# Patient Record
Sex: Female | Born: 1995 | Hispanic: No | Marital: Single | Smoking: Never smoker
Health system: Southern US, Community
[De-identification: ages and names within clinical notes are randomized; demographics above are authoritative.]

## PROBLEM LIST (undated history)

## (undated) HISTORY — PX: WISDOM TOOTH EXTRACTION: SHX21

---

## 2019-01-25 ENCOUNTER — Emergency Department (HOSPITAL_COMMUNITY)
Admission: EM | Admit: 2019-01-25 | Discharge: 2019-01-25 | Disposition: A | Discharge status: Home / Self Care | Payer: Self-pay | Attending: Emergency Medicine | Admitting: Emergency Medicine

## 2019-01-25 ENCOUNTER — Emergency Department (HOSPITAL_COMMUNITY): Payer: Self-pay

## 2019-01-25 ENCOUNTER — Encounter (HOSPITAL_COMMUNITY): Payer: Self-pay | Admitting: Emergency Medicine

## 2019-01-25 ENCOUNTER — Other Ambulatory Visit: Payer: Self-pay

## 2019-01-25 DIAGNOSIS — R220 Localized swelling, mass and lump, head: Secondary | ICD-10-CM

## 2019-01-25 DIAGNOSIS — R600 Localized edema: Secondary | ICD-10-CM | POA: Insufficient documentation

## 2019-01-25 LAB — CBC WITH DIFFERENTIAL/PLATELET
Abs Immature Granulocytes: 0.06 10*3/uL (ref 0.00–0.07)
Basophils Absolute: 0 10*3/uL (ref 0.0–0.1)
Basophils Relative: 0 %
Eosinophils Absolute: 0 10*3/uL (ref 0.0–0.5)
Eosinophils Relative: 0 %
HCT: 42.3 % (ref 36.0–46.0)
Hemoglobin: 14.5 g/dL (ref 12.0–15.0)
Immature Granulocytes: 1 %
Lymphocytes Relative: 10 %
Lymphs Abs: 1.2 10*3/uL (ref 0.7–4.0)
MCH: 31.1 pg (ref 26.0–34.0)
MCHC: 34.3 g/dL (ref 30.0–36.0)
MCV: 90.8 fL (ref 80.0–100.0)
Monocytes Absolute: 0.6 10*3/uL (ref 0.1–1.0)
Monocytes Relative: 5 %
Neutro Abs: 10.5 10*3/uL — ABNORMAL HIGH (ref 1.7–7.7)
Neutrophils Relative %: 84 %
Platelets: 321 10*3/uL (ref 150–400)
RBC: 4.66 MIL/uL (ref 3.87–5.11)
RDW: 11.9 % (ref 11.5–15.5)
WBC: 12.4 10*3/uL — ABNORMAL HIGH (ref 4.0–10.5)
nRBC: 0 % (ref 0.0–0.2)

## 2019-01-25 LAB — COMPREHENSIVE METABOLIC PANEL
ALT: 36 U/L (ref 0–44)
AST: 35 U/L (ref 15–41)
Albumin: 4.3 g/dL (ref 3.5–5.0)
Alkaline Phosphatase: 90 U/L (ref 38–126)
Anion gap: 11 (ref 5–15)
BUN: 9 mg/dL (ref 6–20)
CO2: 20 mmol/L — ABNORMAL LOW (ref 22–32)
Calcium: 9.3 mg/dL (ref 8.9–10.3)
Chloride: 106 mmol/L (ref 98–111)
Creatinine, Ser: 0.72 mg/dL (ref 0.44–1.00)
GFR calc Af Amer: >60 mL/min (ref >60)
GFR calc non Af Amer: >60 mL/min (ref >60)
Glucose, Bld: 87 mg/dL (ref 70–99)
Potassium: 3.6 mmol/L (ref 3.5–5.1)
Sodium: 137 mmol/L (ref 135–145)
Total Bilirubin: 0.5 mg/dL (ref 0.3–1.2)
Total Protein: 8.3 g/dL — ABNORMAL HIGH (ref 6.5–8.1)

## 2019-01-25 LAB — I-STAT BETA HCG BLOOD, ED (MC, WL, AP ONLY): I-stat hCG, quantitative: <5 m[IU]/mL (ref <5)

## 2019-01-25 LAB — LACTIC ACID, PLASMA: Lactic Acid, Venous: 1.1 mmol/L (ref 0.5–1.9)

## 2019-01-25 MED ORDER — CLINDAMYCIN PHOSPHATE 900 MG/50ML IV SOLN
900.0000 mg | Freq: Once | INTRAVENOUS | Status: AC
  Administered 2019-01-25: 900 mg via INTRAVENOUS
  Filled 2019-01-25: qty 50

## 2019-01-25 MED ORDER — SODIUM CHLORIDE 0.9 % IV BOLUS (SEPSIS)
1000.0000 mL | Freq: Once | INTRAVENOUS | Status: AC
  Administered 2019-01-25: 1000 mL via INTRAVENOUS

## 2019-01-25 MED ORDER — CHLORHEXIDINE GLUCONATE 0.12 % MT SOLN
15.0000 mL | Freq: Two times a day (BID) | OROMUCOSAL | Status: DC
  Administered 2019-01-25: 15 mL via OROMUCOSAL
  Filled 2019-01-25: qty 15

## 2019-01-25 MED ORDER — CLINDAMYCIN HCL 150 MG PO CAPS: 300.0000 mg | ORAL_CAPSULE | Freq: Four times a day (QID) | ORAL | 0 refills | Status: AC

## 2019-01-25 MED ORDER — IOHEXOL 300 MG/ML  SOLN
75.0000 mL | Freq: Once | INTRAMUSCULAR | Status: AC | PRN
  Administered 2019-01-25: 75 mL via INTRAVENOUS

## 2019-01-25 MED ORDER — ACETAMINOPHEN 160 MG/5ML PO SOLN
650.0000 mg | Freq: Once | ORAL | Status: AC
  Administered 2019-01-25: 650 mg via ORAL
  Filled 2019-01-25: qty 20.3

## 2019-01-25 NOTE — ED Provider Notes (Signed)
[facility] EMERGENCY DEPARTMENT Provider Note   CSN: [id number] Arrival date & time: 01/25/19  1707    History   Chief Complaint Chief Complaint  Patient presents with   Facial Swelling    HPI Sheri Vega is a 23 y.o. female.   Pt presents today stating that she had a wisdom tooth extracted about 4 hours ago.   Pt states that she was told to "go to the ED if the swelling got bad".   Pt presenting for increased swelling and pain to right lower jaw.   She denies fever, difficulty swallowing, voice changes, vomiting.  She has no other PMH/MEDS/SBI.  She is currently taking amoxicillin and flagyl.   She appears mildly ill, but non-toxic.  The history is provided by the patient.    History reviewed. No pertinent past medical history.  There are no active problems to display for this patient.      OB History   No obstetric history on file.      Home Medications    Prior to Admission medications   Not on File    Family History No family history on file.  Social History Social History   Tobacco Use   Smoking status: Never Smoker   Smokeless tobacco: Never Used  Substance Use Topics   Alcohol use: Yes   Drug use: Never     Allergies   Patient has no known allergies.   Review of Systems Review of Systems  Constitutional: Negative for chills and fever.  HENT: Positive for dental problem and facial swelling. Negative for drooling, ear pain, rhinorrhea, sinus pressure, sore throat and trouble swallowing.   Eyes: Negative for pain.  Respiratory: Negative for choking and shortness of breath.   Cardiovascular: Negative for chest pain.  Gastrointestinal: Negative for abdominal pain, nausea and vomiting.  Musculoskeletal: Positive for neck pain (right anterior neck pain).  Skin: Negative for rash.  Neurological: Negative for dizziness and headaches.     Physical Exam Updated Vital Signs BP 111/69    Pulse 76    Temp (!) 100.5  F (38.1 C) (Oral)    Resp 18    SpO2 98%   Physical Exam Vitals signs and nursing note reviewed.  Constitutional:      General: She is not in acute distress.    Appearance: Normal appearance. She is ill-appearing. She is not toxic-appearing or diaphoretic.  HENT:     Head: Normocephalic and atraumatic.     Right Ear: Tympanic membrane normal.     Left Ear: Tympanic membrane normal.     Nose: Nose normal.     Mouth/Throat:     Mouth: Mucous membranes are moist.     Comments: Noted significant swelling to right lower jaw and right lateral neck;  -trismus; noted extracted tooth with packing in place;  Small amount of blood noted to region;  No purulent discharge or foul smelling discharge;  Uvula midline;  No swelling to posterior pharynx;  No stridor;  Swelling noted lateral to tooth, but no swelling under tongue or floor of mouth;  No apparent lymphadenopathy;  Pt handling secretions well;  Eyes:     General:        Right eye: No discharge.        Left eye: No discharge.  Pulmonary:     Effort: Pulmonary effort is normal. No respiratory distress.  Abdominal:     General: Abdomen is flat. There is no distension.  Tenderness: There is no abdominal tenderness.  Skin:    General: Skin is warm.  Neurological:     Mental Status: She is alert and oriented to person, place, and time.     Coordination: Coordination normal.  Psychiatric:        Mood and Affect: Mood normal.        Behavior: Behavior normal.      ED Treatments / Results  Labs (all labs ordered are listed, but only abnormal results are displayed) Labs Reviewed  CBC WITH DIFFERENTIAL/PLATELET - Abnormal; Notable for the following components:      Result Value   WBC 12.4 (*)    Neutro Abs 10.5 (*)    All other components within normal limits  COMPREHENSIVE METABOLIC PANEL - Abnormal; Notable for the following components:   CO2 20 (*)    Total Protein 8.3 (*)    All other components within normal limits    CULTURE, BLOOD (ROUTINE X 2)  CULTURE, BLOOD (ROUTINE X 2)  LACTIC ACID, PLASMA  I-STAT BETA HCG BLOOD, ED ([facility], [facility], [facility] ONLY)    EKG None  Radiology Ct Soft Tissue Neck W Contrast  Result Date: 01/25/2019 CLINICAL DATA:  Status post extraction of wisdom teeth. Worsening swelling. EXAM: CT NECK WITH CONTRAST TECHNIQUE: Multidetector CT imaging of the neck was performed using the standard protocol following the bolus administration of intravenous contrast. CONTRAST:  7https://[email or web address] HospCherry CrLu575Lesly RubensteAvera Marshall Reg Med CenteriALa52https://el785 5Fannin Regional HospRichmLu530Lesly RubensteSpartanburg Regional Medical CenteriARo89https://el(551)1[facility] Surgery DiviPresLu3Lesly RubensteNortheast Rehabilitation HospitaliALil31https://el(252)3Ucsd Center For Surgery Of EncinitaLeLu(416Lesly RubensteEastern State HospitaliADelawa81https://el(254)8[facility] CPotsLu5Lesly RubensteSun City Az Endoscopy Asc LLCiACuyl20https://el225Bellin Health Oconto HospBlawenbLu(504)Lesly RubensteNortheastern CenteriAWest Ma70https://el817-4[facility] LebElliLu403Lesly RubensteYuma Regional Medical CenteriAThre49https://el514Middlesex HospJacksonviLu(437Lesly RubensteWest Shore Surgery Center LtdiAEch35https://el(234)2[facility] KaFloral PLu(769Lesly RubensteOverland Park Reg Med CtriACeda81https://el(813)2[facility] ORLu250Lesly RubensteValir Rehabilitation Hospital Of OkciA37https://el970-6Alb[facility] Memorial HospParkLu561Lesly RubensteCrane Creek Surgical Partners LLCiAEl64https://el718-4[facility] CheSam RaybLu302Lesly RubensteGastro Care LLCiARo1https://el914-1The Out[facility] Banks HospBenton HarLu228Lesly RubensteSummit Surgical Center LLCiAR28https://el437-3[facility] BurLu571Lesly RubensteCenter For Advanced SurgeryiAL42https://el952 2[facility] Charl[facility] Memorial HospVillage GrLu(309)Lesly RubensteDesoto Regional Health SystemiAWaln22https://el812Mor[facility] County HospSayreviLu463Lesly RubensteEncompass Health Rehabilitation Hospital Of TallahasseeiACl40https://el640Hen[facility] Doctors' Hospital - PaHelmeLu413Lesly RubensteWeimar Medical CenteriAChoct53https://el806Silicon Valley Surgery CenteGlenns FeLu(437Lesly RubensteTemecula Ca United Surgery Center LP Dba United Surgery Center Temeculai30https://el317 3[facility] CLincLu843Lesly RubensteAirport Endoscopy CenteriAPleasant ViewonialellingXOL 300 MG/ML  SOLN COMPARISON:  None. FINDINGS: PHARYNX AND LARYNX: --Nasopharynx: Fossae of Rosenmuller are clear. Normal adenoid tonsils for age. --Oral cavity and oropharynx: Tooth 32 has been extracted. There is surrounding soft tissue swelling and soft tissue emphysema that is likely postoperative. There is edema of the right masseter muscle. --Hypopharynx: Normal vallecula and pyriform sinuses. --Larynx: Normal epiglottis and pre-epiglottic space. Normal aryepiglottic and vocal folds. --Retropharyngeal space: No abscess, effusion or lymphadenopathy. SALIVARY GLANDS: --Parotid: No mass lesion or inflammation. No sialolithiasis or ductal dilatation. --Submandibular: Symmetric without inflammation. No sialolithiasis or ductal dilatation. --Sublingual: Normal. No ranula or other visible lesion of the base of tongue and floor of mouth. THYROID: Normal. LYMPH NODES: Multiple level 1B lymph nodes measure up to 6 mm. Right level 2A nodes measure up to 10 mm. VASCULAR: Major cervical vessels are patent. LIMITED INTRACRANIAL: Normal. VISUALIZED ORBITS: Normal. MASTOIDS AND VISUALIZED PARANASAL SINUSES: No fluid levels or advanced mucosal thickening. No mastoid effusion. SKELETON: No bony spinal canal stenosis. No lytic or blastic lesions. UPPER CHEST: Clear. OTHER: There is lower right facial soft tissue swelling with thickening of the platysma muscle extending to the submandibular region.   IMPRESSION: 1. Status post  extraction of tooth 32 with associated soft tissue swelling and edema of the right masseter muscle. No abscess or drainable fluid collection. 2. Reactive level 1B and 2A lymphadenopathy. 3. Right-sided facial subcutaneous edema and thickening of the platysma muscle. Electronically Signed   By: Kevin  Herman M.D.   On: 01/25/2019 19:56    Procedures Procedures (including critical care time)  Medications Ordered in ED Medications  acetaminophen (TYLENOL) solution 650 mg (650 mg Oral Given 01/25/19 1913)  clindamycin (CLEOCIN) IVPB 900 mg (900 mg Intravenous New Bag/Given 01/25/19 1951)  sodium chloride 0.9 % bolus 1,000 mL (1,000 mLs Intravenous New Bag/Given 01/25/19 1949)  iohexol (OMNIPAQUE) 300 MG/ML solution 75 mL (75 mLs Intravenous Contrast Given 01/25/19 1930)     Initial Impression / Assessment and Plan / ED Course  I have reviewed the triage vital signs and the nursing  notes.  Pertinent labs & imaging results that were available during my care of the patient were reviewed by me and considered in my medical decision making (see chart for details).       Labs/imaging initiated to evaluate for abscess or possible early Ludwigs, although felt to be less likely since procedure was only about 4 hours ago.   Noted low grade fever and leukocytosis;   I feel this to be more reactive to oral trauma rather than sign of true infection.   IV was started here and pt was given fluids, clindamycin 900 mg IV X 1, tylenol for fever control.    CT scan results reviewed;   There is no sign of abscess.   There is edema to right masseter and platysma muscles.   I have called Dr. Garvin Fila, on call dentistry.   I have reviewed pt HPI, PE, labs and imaging.  He states that infection this early s/p extraction is very unlikely.   He feels that since there is a small amount of blood around the packing w/out purulent/odorous discharge that there is likely a small periosteal bleeding which is not uncommon in wisdom teeth  extractions.   He also agrees that the leukocytosis and low grade fever are likely reactive.   He advises a chlorhexadine rinse and to continue the clindamycin at home.   Discontinue amoxicillin and flagyl.  I have counseled pt on all findings and plan of care.   She understands to use ice packs to help with swelling and pain and to call her dentist tomorrow.  I have also given her Dr. Sol Blazing number if needed.   Pt understands and is agreeable to plan.  Final Clinical Impressions(s) / ED Diagnoses   Final diagnoses:  None    ED Discharge Orders    None       Joeann Steppe K, PA-C 01/25/19 2110    Maia Plan, MD 01/26/19 [phone]

## 2019-01-25 NOTE — ED Triage Notes (Signed)
PT taking amoxicillin 500mg   POI every 6 hrs and  Metronidazole 250mg   1 PO every 8 hhrs. Tooth was pulled this AM.  Pt has swelling to RT lower face.

## 2019-01-25 NOTE — Discharge Instructions (Signed)
Please use ice pack to reduce swelling at home.   Discontinue amoxicillin and flagyl.  Start clindamycin tomorrow.   Please call dentistry tomorrow for follow up appointment.   Return to ED sooner if needed.

## 2019-01-25 NOTE — ED Triage Notes (Signed)
Pt reports having infection to her mouth had surgery today to have wisdom teeth removed and area drained. Pt reports she was told to come her for increased swelling. Pt reports swelling has increased. Pt able to talk, able to swallow saliva, reports pain with swallowing. She denies sick contact or recent travel

## 2019-01-30 LAB — CULTURE, BLOOD (ROUTINE X 2)
Culture: NO GROWTH
Culture: NO GROWTH
Special Requests: ADEQUATE
Special Requests: ADEQUATE

## 2020-02-25 ENCOUNTER — Ambulatory Visit: Payer: Self-pay | Attending: Internal Medicine

## 2020-02-25 DIAGNOSIS — Z20822 Contact with and (suspected) exposure to covid-19: Secondary | ICD-10-CM | POA: Insufficient documentation

## 2020-02-26 LAB — SARS-COV-2, NAA 2 DAY TAT

## 2020-02-26 LAB — NOVEL CORONAVIRUS, NAA: SARS-CoV-2, NAA: NOT DETECTED

## 2020-05-13 ENCOUNTER — Emergency Department
Admission: EM | Admit: 2020-05-13 | Discharge: 2020-05-13 | Disposition: A | Discharge status: Home / Self Care | Payer: Self-pay | Attending: Emergency Medicine | Admitting: Emergency Medicine

## 2020-05-13 ENCOUNTER — Other Ambulatory Visit: Payer: Self-pay

## 2020-05-13 ENCOUNTER — Encounter: Payer: Self-pay | Admitting: Intensive Care

## 2020-05-13 DIAGNOSIS — K120 Recurrent oral aphthae: Secondary | ICD-10-CM | POA: Insufficient documentation

## 2020-05-13 LAB — GROUP A STREP BY PCR: Group A Strep by PCR: NOT DETECTED

## 2020-05-13 MED ORDER — LIDOCAINE VISCOUS HCL 2 % MT SOLN: 15.0000 mL | OROMUCOSAL | 0 refills | Status: DC | PRN

## 2020-05-13 MED ORDER — LIDOCAINE VISCOUS HCL 2 % MT SOLN
15.0000 mL | Freq: Once | OROMUCOSAL | Status: AC
  Administered 2020-05-13: 15 mL via OROMUCOSAL
  Filled 2020-05-13: qty 15

## 2020-05-13 MED ORDER — ACETAMINOPHEN 325 MG PO TABS
650.0000 mg | ORAL_TABLET | Freq: Once | ORAL | Status: AC | PRN
  Administered 2020-05-13: 650 mg via ORAL
  Filled 2020-05-13: qty 2

## 2020-05-13 MED ORDER — PREDNISONE 20 MG PO TABS: 40.0000 mg | ORAL_TABLET | Freq: Every day | ORAL | 0 refills | Status: AC

## 2020-05-13 MED ORDER — CLOTRIMAZOLE 10 MG MT TROC: 10.0000 mg | Freq: Every day | OROMUCOSAL | 0 refills | Status: DC

## 2020-05-13 MED ORDER — PREDNISONE 20 MG PO TABS
60.0000 mg | ORAL_TABLET | Freq: Once | ORAL | Status: AC
  Administered 2020-05-13: 60 mg via ORAL
  Filled 2020-05-13: qty 3

## 2020-05-13 NOTE — ED Provider Notes (Signed)
[facility] Emergency Department Provider Note ____________________________________________  Time seen: [phone]  I have reviewed the triage vital signs and the nursing notes.  HISTORY  Chief Complaint  Fever, Mouth Lesions, and Sore Throat  HPI Sheri Vega is a 24 y.o. female presents to the ED  for evaluation of acute sore throat pain and mouth pain.  Patient reports "not feeling well," since Wednesday.  Today she reports awakening with patches in her mouth.  She reports fevers as well today.  She has been able to swallow liquids, and control her secretions.  She denies any difficulty breathing or swallowing.  No recent dental work is reported.  History reviewed. No pertinent past medical history.  There are no problems to display for this patient.   Past Surgical History:  Procedure Laterality Date  . WISDOM TOOTH EXTRACTION      Prior to Admission medications   Medication Sig Start Date End Date Taking? Authorizing Provider  clotrimazole (MYCELEX) 10 MG troche Take 1 tablet (10 mg total) by mouth 5 (five) times daily. 05/13/20   Darrnell Mangiaracina, Charlesetta Ivory, PA-C  lidocaine (XYLOCAINE) 2 % solution Use as directed 15 mLs in the mouth or throat every 4 (four) hours as needed for mouth pain. 05/13/20   Alfrieda Tarry, Charlesetta Ivory, PA-C  predniSONE (DELTASONE) 20 MG tablet Take 2 tablets (40 mg total) by mouth daily with breakfast for 5 days. 05/13/20 05/18/20  Colleene Swarthout, Charlesetta Ivory, PA-C    Allergies Patient has no known allergies.  History reviewed. No pertinent family history.  Social History Social History   Tobacco Use  . Smoking status: Never Smoker  . Smokeless tobacco: Never Used  Vaping Use  . Vaping Use: Never used  Substance Use Topics  . Alcohol use: Yes    Alcohol/week: 3.0 standard drinks    Types: 3 Shots of liquor per week  . Drug use: Never    Review of Systems  Constitutional: Positive for fever. Eyes: Negative for visual  changes. ENT: Positive for sore throat and mouth ulcers Cardiovascular: Negative for chest pain. Respiratory: Negative for shortness of breath. Gastrointestinal: Negative for abdominal pain, vomiting and diarrhea. Genitourinary: Negative for dysuria. Musculoskeletal: Negative for back pain. Skin: Negative for rash. Neurological: Negative for headaches, focal weakness or numbness. ____________________________________________  PHYSICAL EXAM:  VITAL SIGNS: ED Triage Vitals [05/13/20 1309]  Enc Vitals Group     BP (!) 133/90     Pulse Rate (!) 120     Resp 18     Temp (!) 102.8 F (39.3 C)     Temp Source Oral     SpO2 99 %     Weight (!) 210 lb (95.3 kg)     Height 5\' 7"  (1.702 m)     Head Circumference      Peak Flow      Pain Score 8     Pain Loc      Pain Edu?      Excl. in GC?     Constitutional: Alert and oriented. Well appearing and in no distress. Head: Normocephalic and atraumatic. Eyes: Conjunctivae are normal. PERRL. Normal extraocular movements Ears: Canals clear. TMs intact bilaterally. Nose: No congestion/rhinorrhea/epistaxis. Mouth/Throat: Mucous membranes are moist.  Uvula is midline and tonsils are flat.  Oropharynx is erythematous with scattered white, flat, ulcerated patches consistent with aphthous ulcers.  The ulcers are located on the uvula, hard palate, and tongue.  Patient also has some increased markings to her tongue  with white lesions.  No brawny sublingual erythema is appreciated no focal gum swelling or abscess is noted. Neck: Supple. No thyromegaly. Hematological/Lymphatic/Immunological: No cervical lymphadenopathy. Cardiovascular: Normal rate, regular rhythm. Normal distal pulses. Respiratory: Normal respiratory effort. No wheezes/rales/rhonchi. Gastrointestinal: Soft and nontender. No distention. Musculoskeletal: Nontender with normal range of motion in all extremities.  Neurologic:  Normal gait without ataxia. Normal speech and language. No  gross focal neurologic deficits are appreciated. Skin:  Skin is warm, dry and intact. No rash noted. ____________________________________________   LABS (pertinent positives/negatives) Labs Reviewed  GROUP A STREP BY PCR  ____________________________________________  PROCEDURES  Prednisone 60 mg PO Lidocaine 2 % viscous gargle Tylenol 650 mg PO  Procedures ____________________________________________  INITIAL IMPRESSION / ASSESSMENT AND PLAN / ED COURSE  DDX: strep pharyngitis, stomatitis, dental abscess, GERD  Patient with ED evaluation of sore throat and onset of irritation, erythema, and ulceration to the mouth.  Patient presents today with onset of symptoms including fever and sore throat pain.  Her exam is benign as it shows no signs of acute dehydration, respiratory distress, or airway compromise.  She does have multiple shallow lesions consistent with aphthous ulcers.  Patient will be treated empirically with lidocaine mouthwash and prednisone.  Prescription for miconazole atrocious is also provided for any possible coinfection with Candida.  Patient will follow with primary provider return to the ED as needed.  Sheri Vega was evaluated in Emergency Department on 05/13/2020 for the symptoms described in the history of present illness. She was evaluated in the context of the global COVID-19 pandemic, which necessitated consideration that the patient might be at risk for infection with the SARS-CoV-2 virus that causes COVID-19. Institutional protocols and algorithms that pertain to the evaluation of patients at risk for COVID-19 are in a state of rapid change based on information released by regulatory bodies including the CDC and federal and state organizations. These policies and algorithms were followed during the patient's care in the ED. ____________________________________________  FINAL CLINICAL IMPRESSION(S) / ED DIAGNOSES  Final diagnoses:  Aphthous stomatitis       Karmen Stabs, Charlesetta Ivory, PA-C 05/13/20 1910    Emily Filbert, MD 05/14/20 [phone]

## 2020-05-13 NOTE — ED Triage Notes (Signed)
Patient presents with sore throat and c/o "not feeling well" since Wednesday. Today woke up with bumps/patches all in mouth.

## 2020-05-13 NOTE — Discharge Instructions (Signed)
You are being treated for oral ulcers.  Take the prescription steroid as directed.  Use the viscous lidocaine solution as needed.  Eat soft foods and soothing liquids and continue to monitor and treat fevers.  Return to the ED as necessary.

## 2020-06-15 ENCOUNTER — Ambulatory Visit (HOSPITAL_COMMUNITY)
Admission: EM | Admit: 2020-06-15 | Discharge: 2020-06-15 | Disposition: A | Discharge status: Home / Self Care | Payer: Self-pay | Attending: Family Medicine | Admitting: Family Medicine

## 2020-06-15 ENCOUNTER — Other Ambulatory Visit: Payer: Self-pay

## 2020-06-15 ENCOUNTER — Encounter (HOSPITAL_COMMUNITY): Payer: Self-pay

## 2020-06-15 DIAGNOSIS — J039 Acute tonsillitis, unspecified: Secondary | ICD-10-CM | POA: Insufficient documentation

## 2020-06-15 LAB — POCT RAPID STREP A, ED / UC: Streptococcus, Group A Screen (Direct): NEGATIVE

## 2020-06-15 MED ORDER — ACETAMINOPHEN 325 MG PO TABS
650.0000 mg | ORAL_TABLET | Freq: Once | ORAL | Status: AC
  Administered 2020-06-15: 650 mg via ORAL

## 2020-06-15 MED ORDER — AMOXICILLIN-POT CLAVULANATE 875-125 MG PO TABS: 1.0000 | ORAL_TABLET | Freq: Two times a day (BID) | ORAL | 0 refills | Status: AC

## 2020-06-15 MED ORDER — ACETAMINOPHEN 325 MG PO TABS
ORAL_TABLET | ORAL | Status: AC
  Filled 2020-06-15: qty 2

## 2020-06-15 MED ORDER — HYDROCODONE-ACETAMINOPHEN 7.5-325 MG/15ML PO SOLN: 10.0000 mL | Freq: Four times a day (QID) | ORAL | 0 refills | Status: AC | PRN

## 2020-06-15 NOTE — Discharge Instructions (Signed)
Take the antibiotic 2 x a day Take 2 doses today Take the pain medicine as needed Use salt water gargles Try to drink mor water Throat culture will be available 2-3 days

## 2020-06-15 NOTE — ED Provider Notes (Signed)
[facility]    CSN: [id number] Arrival date & time: 06/15/20  0920      History   Chief Complaint Chief Complaint  Patient presents with  . Sore Throat    HPI Sheri Vega is a 24 y.o. female.   HPI  Patient has a very painful sore throat.  Started yesterday.  No known exposure to strep or Covid.  She works in a [employer].  Her temperature has been high overnight with fever and chills.  Headache.  Painful swallowing.  No runny nose or cough.  History reviewed. No pertinent past medical history.  There are no problems to display for this patient.   Past Surgical History:  Procedure Laterality Date  . WISDOM TOOTH EXTRACTION      OB History   No obstetric history on file.      Home Medications    Prior to Admission medications   Medication Sig Start Date End Date Taking? Authorizing Provider  amoxicillin-clavulanate (AUGMENTIN) 875-125 MG tablet Take 1 tablet by mouth every 12 (twelve) hours. 06/15/20   Eustace Moore, MD  HYDROcodone-acetaminophen (HYCET) 7.5-325 mg/15 ml solution Take 10 mLs by mouth 4 (four) times daily as needed for moderate pain. 06/15/20   Eustace Moore, MD    Family History History reviewed. No pertinent family history.  Social History Social History   Tobacco Use  . Smoking status: Never Smoker  . Smokeless tobacco: Never Used  Vaping Use  . Vaping Use: Never used  Substance Use Topics  . Alcohol use: Yes    Alcohol/week: 3.0 standard drinks    Types: 3 Shots of liquor per week  . Drug use: Never     Allergies   Patient has no known allergies.   Review of Systems Review of Systems See HPI  Physical Exam Triage Vital Signs ED Triage Vitals [06/15/20 1106]  Enc Vitals Group     BP 110/63     Pulse Rate (!) 105     Resp 20     Temp (!) 101.3 F (38.5 C)     Temp Source Oral     SpO2 100 %     Weight      Height      Head Circumference      Peak Flow      Pain Score 9     Pain Loc       Pain Edu?      Excl. in GC?    No data found.  Updated Vital Signs BP 110/63 (BP Location: Right Arm)   Pulse (!) 105   Temp (!) 101.3 F (38.5 C) (Oral)   Resp 20   SpO2 100%      Physical Exam Constitutional:      Appearance: She is well-developed. She is ill-appearing.     Comments: Appears uncomfortable.  Swallows with difficulty.  Muffled voice.  HENT:     Head: Normocephalic.     Right Ear: Tympanic membrane and ear canal normal.     Left Ear: Tympanic membrane and ear canal normal.     Nose: No congestion.     Mouth/Throat:     Mouth: Mucous membranes are dry.     Pharynx: Uvula midline. Pharyngeal swelling and posterior oropharyngeal erythema present.     Tonsils: No tonsillar exudate or tonsillar abscesses. 3+ on the right. 2+ on the left.  Eyes:     Conjunctiva/sclera: Conjunctivae normal.  Cardiovascular:     Rate and  Rhythm: Normal rate and regular rhythm.  Pulmonary:     Effort: Pulmonary effort is normal.     Breath sounds: Normal breath sounds.  Lymphadenopathy:     Cervical: No cervical adenopathy.  Skin:    General: Skin is warm and dry.     Findings: No rash.  Neurological:     General: No focal deficit present.     Mental Status: She is alert.  Psychiatric:        Behavior: Behavior normal.      UC Treatments / Results  Labs (all labs ordered are listed, but only abnormal results are displayed) Labs Reviewed  CULTURE, GROUP A STREP [facility])  POCT RAPID STREP A, ED / UC    EKG   Radiology No results found.  Procedures Procedures (including critical care time)  Medications Ordered in UC Medications  acetaminophen (TYLENOL) tablet 650 mg (650 mg Oral Given 06/15/20 1112)    Initial Impression / Assessment and Plan / UC Course  I have reviewed the triage vital signs and the nursing notes.  Pertinent labs & imaging results that were available during my care of the patient were reviewed by me and considered in my medical decision  making (see chart for details).  Clinical Course as of Jun 15 2244  Thu Jun 15, 2020  1131 POCT Rapid Strep [YN]    Clinical Course User Index [YN] Eustace Moore, MD    Rapid strep is negative.  Patient has significant tonsillitis with tonsillar swelling that is a bit asymmetric.  Worry about the development of a peritonsillar abscess.  We did treat her with antibiotics.  Pain management.  Return if worse at any time instead of better. Final Clinical Impressions(s) / UC Diagnoses   Final diagnoses:  Tonsillitis     Discharge Instructions     Take the antibiotic 2 x a day Take 2 doses today Take the pain medicine as needed Use salt water gargles Try to drink mor water Throat culture will be available 2-3 days    ED Prescriptions    Medication Sig Dispense Auth. Provider   amoxicillin-clavulanate (AUGMENTIN) 875-125 MG tablet Take 1 tablet by mouth every 12 (twelve) hours. 14 tablet Eustace Moore, MD   HYDROcodone-acetaminophen (HYCET) 7.5-325 mg/15 ml solution Take 10 mLs by mouth 4 (four) times daily as needed for moderate pain. 120 mL Eustace Moore, MD     I have reviewed the PDMP during this encounter.   Eustace Moore, MD 06/15/20 [phone]

## 2020-06-15 NOTE — ED Triage Notes (Signed)
Pt present sore throat, she states it very hard to swallow. symptoms started yesterday.

## 2020-06-17 LAB — CULTURE, GROUP A STREP (THRC)

## 2020-08-02 IMAGING — CT CT NECK WITH CONTRAST
4 of 5 series · 16 of 33 positions shown, 18 images · IV contrast (APPLIED)
Comparison: None.

CLINICAL DATA: Status post extraction of wisdom teeth. Worsening
swelling.

EXAM:
CT NECK WITH CONTRAST
TECHNIQUE: Multidetector CT imaging of the neck was performed using the
standard protocol following the bolus administration of intravenous
contrast.
CONTRAST:  75mL OMNIPAQUE IOHEXOL 300 MG/ML  SOLN

[Series 3: neck 2.0 i31s 3 · axial · 0.47mm/px · z∈[-291,-147]mm · 4 of 120 slices shown, 5 images]
[im 24/120  soft-tissue]
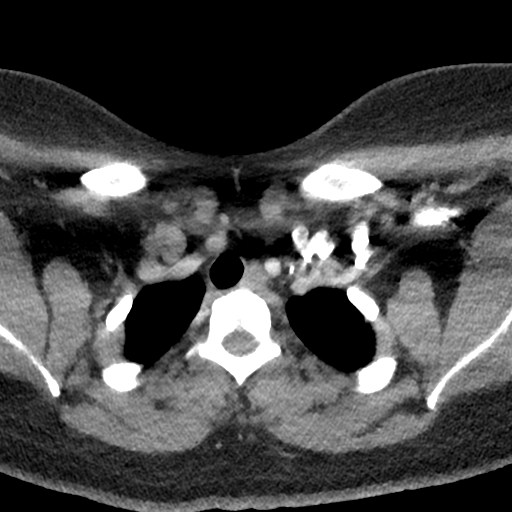
[im 24/120  bone]
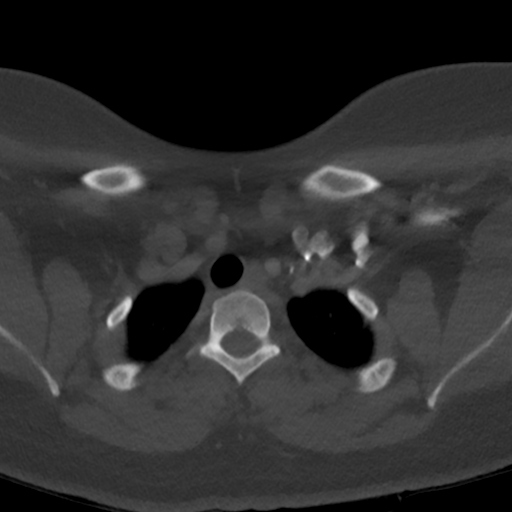
[im 48/120  bone]
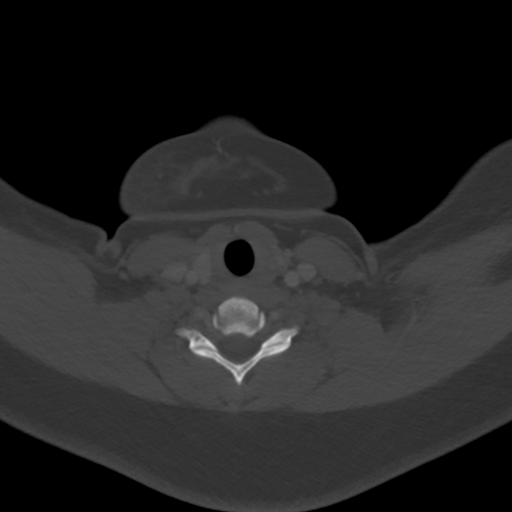
[im 72/120  bone]
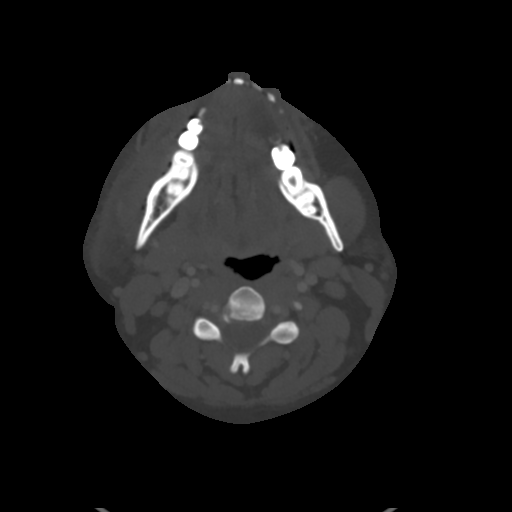
[im 96/120  bone]
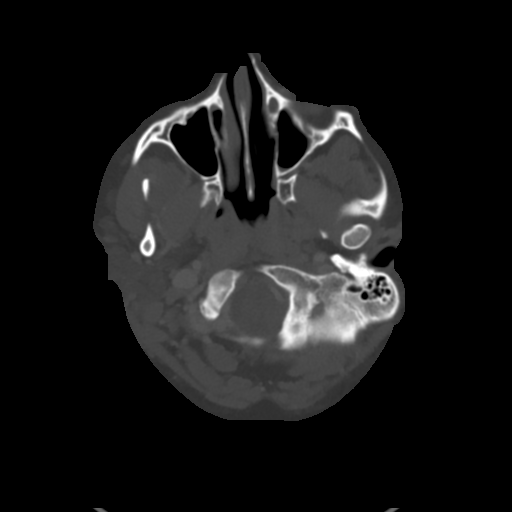

[Series 6: coronal st · coronal · 0.46mm/px · 3 of 111 slices shown]
[im 23/111  bone]
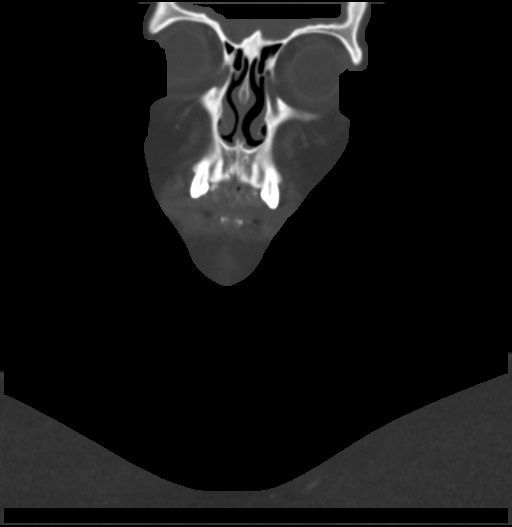
[im 45/111  bone]
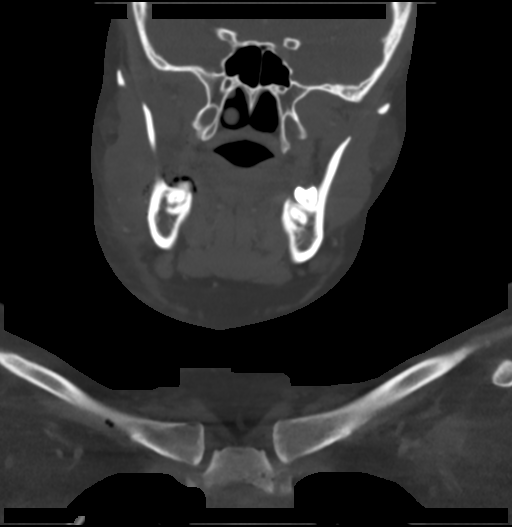
[im 67/111  bone]
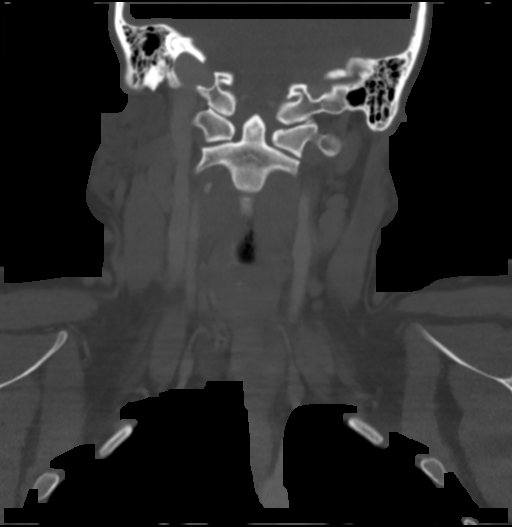

[Series 7: sagittal st · sagittal · 0.47mm/px · 5 of 124 slices shown, 6 images]
[im 42/124  bone]
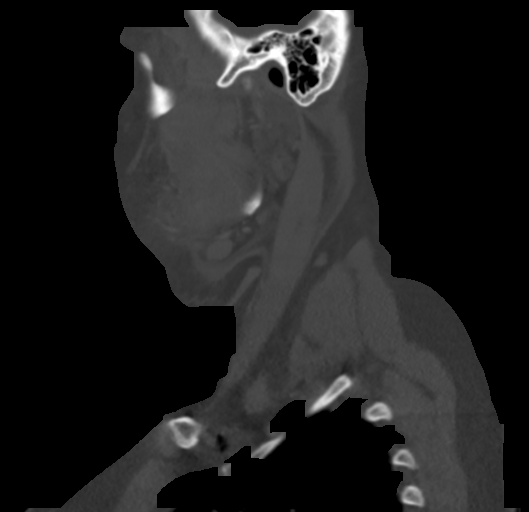
[im 52/124  bone]
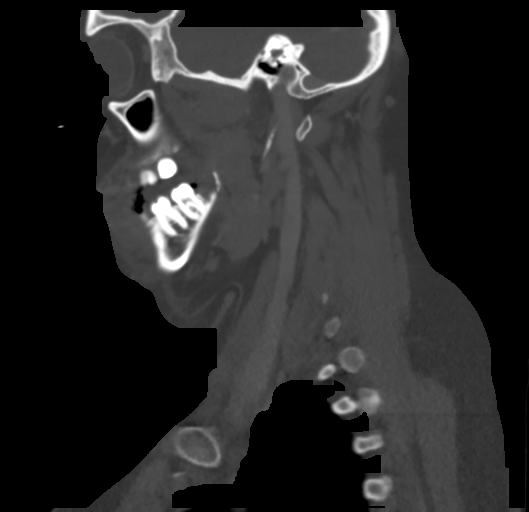
[im 62/124  soft-tissue]
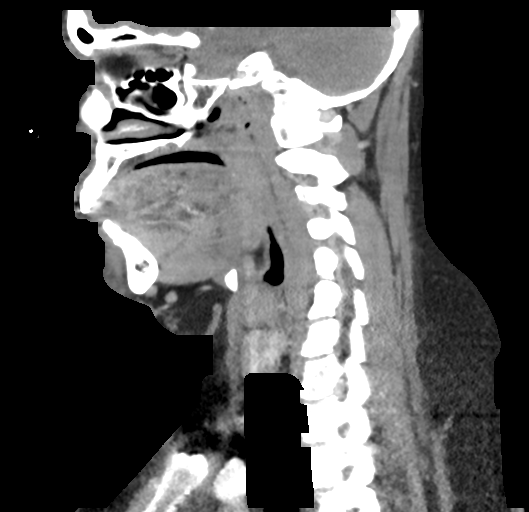
[im 62/124  bone]
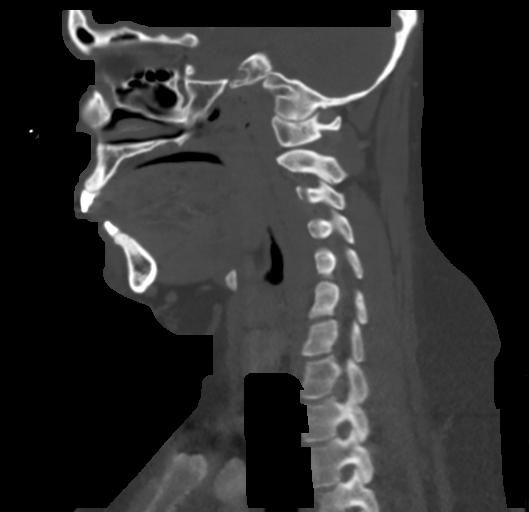
[im 72/124  bone]
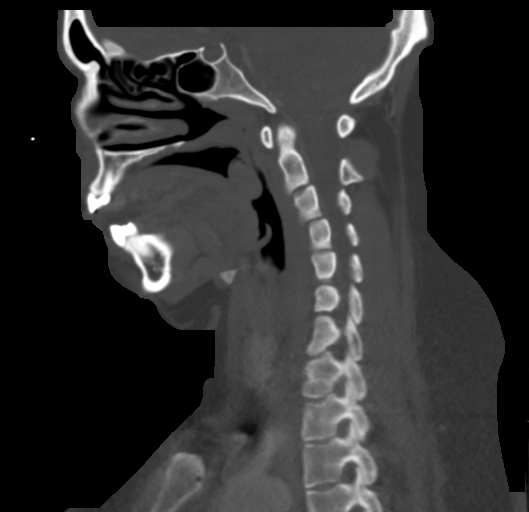
[im 83/124  bone]
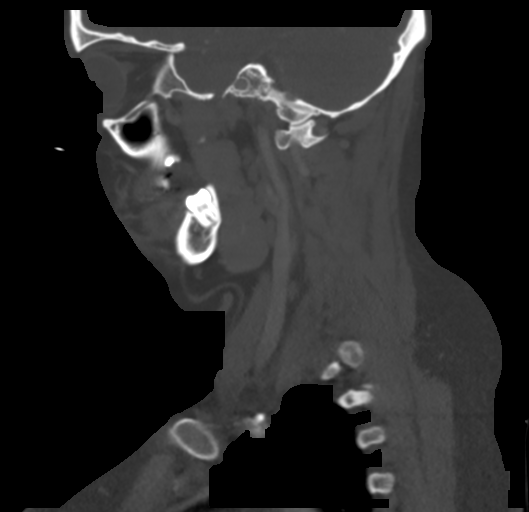

[Series 8: orthogonal st · axial · 0.47mm/px · z∈[-288,-146]mm · 4 of 119 slices shown]
[im 24/119  bone]
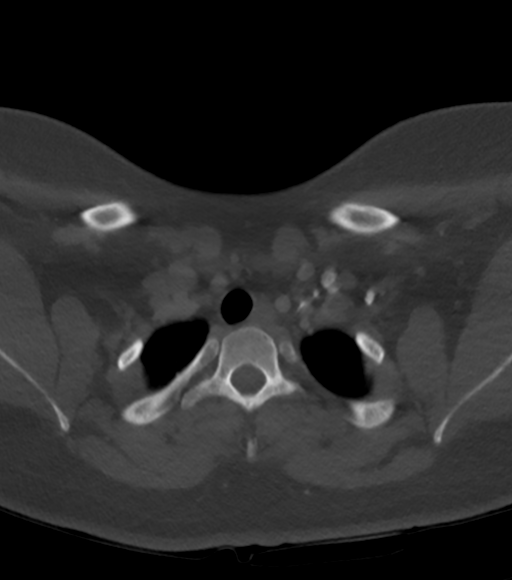
[im 48/119  bone]
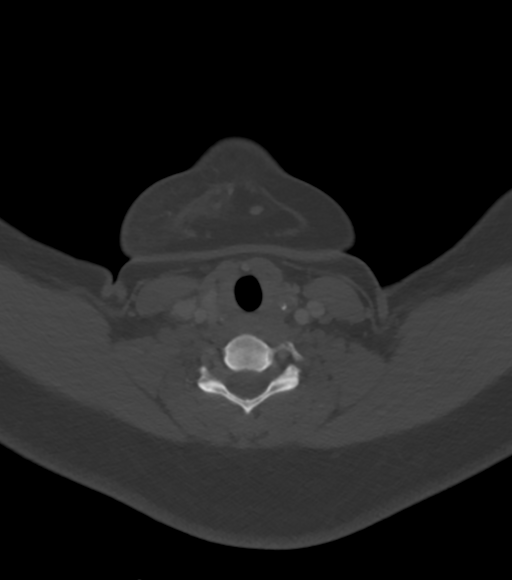
[im 71/119  bone]
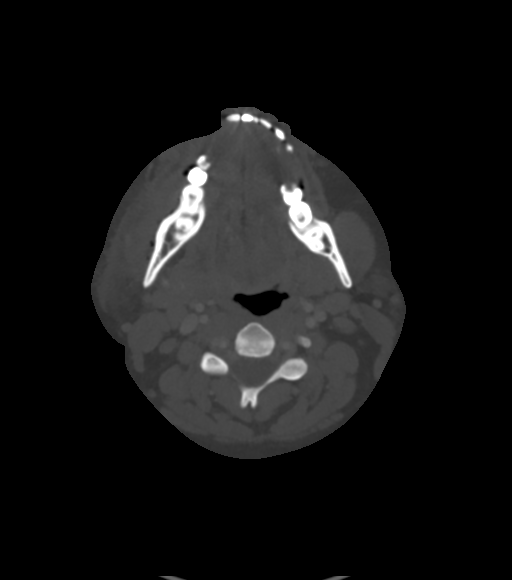
[im 95/119  bone]
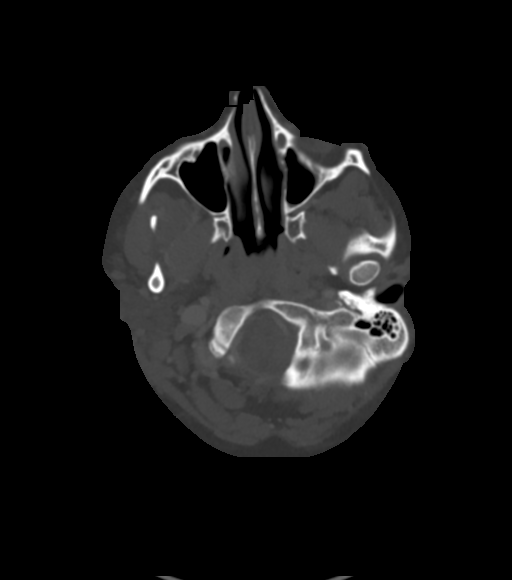

[16 of 33 positions shown; findings below may reference images not displayed]

FINDINGS: PHARYNX AND LARYNX:

--Nasopharynx: Fossae of [place] are clear. Normal adenoid
tonsils for age.

--Oral cavity and oropharynx: Tooth 32 has been extracted. There is
surrounding soft tissue swelling and soft tissue emphysema that is
likely postoperative. There is edema of the right masseter muscle.

--Hypopharynx: Normal vallecula and pyriform sinuses.

--Larynx: Normal epiglottis and pre-epiglottic space. Normal
aryepiglottic and vocal folds.

--Retropharyngeal space: No abscess, effusion or lymphadenopathy.

SALIVARY GLANDS:

--Parotid: No mass lesion or inflammation. No sialolithiasis or
ductal dilatation.

--Submandibular: Symmetric without inflammation. No sialolithiasis
or ductal dilatation.

--Sublingual: Normal. No ranula or other visible lesion of the base
of tongue and floor of mouth.

THYROID: Normal.

LYMPH NODES: Multiple level 1B lymph nodes measure up to 6 mm. Right
level 2A nodes measure up to 10 mm.

VASCULAR: Major cervical vessels are patent.

LIMITED INTRACRANIAL: Normal.

VISUALIZED ORBITS: Normal.

MASTOIDS AND VISUALIZED PARANASAL SINUSES: No fluid levels or
advanced mucosal thickening. No mastoid effusion.

SKELETON: No bony spinal canal stenosis. No lytic or blastic
lesions.

UPPER CHEST: Clear.

OTHER: There is lower right facial soft tissue swelling with
thickening of the platysma muscle extending to the submandibular
region.
IMPRESSION: 1. Status post extraction of tooth 32 with associated soft tissue
swelling and edema of the right masseter muscle. No abscess or
drainable fluid collection.
2. Reactive level 1B and 2A lymphadenopathy.
3. Right-sided facial subcutaneous edema and thickening of the
platysma muscle.
